# Patient Record
Sex: Female | Born: 1955 | Race: Black or African American | Hispanic: No | Smoking: Never smoker
Health system: Southern US, Community
[De-identification: ages and names within clinical notes are randomized; demographics above are authoritative.]

## PROBLEM LIST (undated history)

## (undated) HISTORY — PX: KNEE SURGERY: SHX244

---

## 2016-10-25 ENCOUNTER — Emergency Department (HOSPITAL_COMMUNITY)
Admission: EM | Admit: 2016-10-25 | Discharge: 2016-10-25 | Disposition: A | Discharge status: Home / Self Care | Payer: Self-pay | Attending: Emergency Medicine | Admitting: Emergency Medicine

## 2016-10-25 ENCOUNTER — Emergency Department (HOSPITAL_COMMUNITY): Payer: Self-pay

## 2016-10-25 ENCOUNTER — Encounter (HOSPITAL_COMMUNITY): Payer: Self-pay

## 2016-10-25 DIAGNOSIS — S92321A Displaced fracture of second metatarsal bone, right foot, initial encounter for closed fracture: Secondary | ICD-10-CM | POA: Insufficient documentation

## 2016-10-25 DIAGNOSIS — W108XXA Fall (on) (from) other stairs and steps, initial encounter: Secondary | ICD-10-CM | POA: Insufficient documentation

## 2016-10-25 DIAGNOSIS — S92331A Displaced fracture of third metatarsal bone, right foot, initial encounter for closed fracture: Secondary | ICD-10-CM | POA: Insufficient documentation

## 2016-10-25 DIAGNOSIS — Y9389 Activity, other specified: Secondary | ICD-10-CM | POA: Insufficient documentation

## 2016-10-25 DIAGNOSIS — Y999 Unspecified external cause status: Secondary | ICD-10-CM | POA: Insufficient documentation

## 2016-10-25 DIAGNOSIS — S82832A Other fracture of upper and lower end of left fibula, initial encounter for closed fracture: Secondary | ICD-10-CM | POA: Insufficient documentation

## 2016-10-25 DIAGNOSIS — W19XXXA Unspecified fall, initial encounter: Secondary | ICD-10-CM

## 2016-10-25 DIAGNOSIS — S92301A Fracture of unspecified metatarsal bone(s), right foot, initial encounter for closed fracture: Secondary | ICD-10-CM

## 2016-10-25 DIAGNOSIS — S92341A Displaced fracture of fourth metatarsal bone, right foot, initial encounter for closed fracture: Secondary | ICD-10-CM | POA: Insufficient documentation

## 2016-10-25 DIAGNOSIS — Y9289 Other specified places as the place of occurrence of the external cause: Secondary | ICD-10-CM | POA: Insufficient documentation

## 2016-10-25 LAB — POC URINE PREG, ED: Preg Test, Ur: NEGATIVE

## 2016-10-25 MED ORDER — OXYCODONE-ACETAMINOPHEN 5-325 MG PO TABS: 1.0000 | ORAL_TABLET | ORAL | 0 refills | Status: AC | PRN

## 2016-10-25 NOTE — ED Provider Notes (Signed)
[facility]-EMERGENCY DEPT Provider Note   CSN: [id number] Arrival date & time: 10/25/16  1431     History   Chief Complaint Chief Complaint  Patient presents with  . Fall    HPI Sara Bauer is a 61 y.o. female.  2 days ago, she fell down about 4 steps injuring her left hip, left ankle, right foot. She has been unable to walk since then. She did ice, head, neck, back, upper extremity injury.   The history is provided by the patient.  Fall     History reviewed. No pertinent past medical history.  There are no active problems to display for this patient.   Past Surgical History:  Procedure Laterality Date  . KNEE SURGERY      OB History    No data available       Home Medications    Prior to Admission medications   Not on File    Family History History reviewed. No pertinent family history.  Social History Social History  Substance Use Topics  . Smoking status: Never Smoker  . Smokeless tobacco: Never Used  . Alcohol use Not on file     Allergies   Patient has no known allergies.   Review of Systems Review of Systems  All other systems reviewed and are negative.    Physical Exam Updated Vital Signs BP (!) 142/87   Pulse 81   Temp 98.8 F (37.1 C) (Oral)   Resp 16   SpO2 100%   Physical Exam  Nursing note and vitals reviewed.  61 year old female, resting comfortably and in no acute distress. Vital signs are significant for borderline hypertension. Oxygen saturation is 100%, which is normal. Head is normocephalic and atraumatic. PERRLA, EOMI. Oropharynx is clear. Neck is nontender and supple without adenopathy or JVD. Back is nontender and there is no CVA tenderness. Lungs are clear without rales, wheezes, or rhonchi. Chest is nontender. Heart has regular rate and rhythm without murmur. Abdomen is soft, flat, nontender without masses or hepatosplenomegaly and peristalsis is normoactive. Extremities: There is mild tenderness to  palpation over lateral aspect of the left hip. There is no tenderness over the pelvis. Full range of motion is present in the left hip without pain. There is no tenderness over the right hip or over either knee. There is no significant knee effusion. Full range of motion is present of both knees without pain. There is moderate swelling of the lateral aspect of the left ankle with point tenderness over the lateral malleolus. There is no instability of the ankle mortise and anterior drawer sign is negative. There is no swelling or tenderness of the right ankle. There is swelling across the dorsum of the right foot with tenderness across the proximal metatarsals. There is no obvious deformity. Distal neurovascular exam is intact on both feet with normal sensation, strong pulses, prompt capillary refill. Neurologic: Mental status is normal, cranial nerves are intact, there are no motor or sensory deficits.  ED Treatments / Results   Radiology Dg Ankle Complete Left  Result Date: 10/25/2016 CLINICAL DATA:  Fall. EXAM: LEFT ANKLE COMPLETE - 3+ VIEW COMPARISON:  None FINDINGS: There is an oblique, intra-articular fracture deformity involving the distal fibula. There is overlying soft tissue swelling. The fracture fragments are in near anatomic alignment. Small plantar heel spur. IMPRESSION: 1. Distal fibular fracture. Electronically Signed   By: Signa Kell M.D.   On: 10/25/2016 18:34   Ct Foot Right Wo Contrast  Result  Date: 10/25/2016 CLINICAL DATA:  60 year old female with fall and metatarsal fracture. EXAM: CT OF THE RIGHT FOOT WITHOUT CONTRAST TECHNIQUE: Multidetector CT imaging of the right foot was performed according to the standard protocol. Multiplanar CT image reconstructions were also generated. COMPARISON:  Radiograph dated 10/25/2016 FINDINGS: Bones/Joint/Cartilage There are minimally displaced fractures of the proximal portion of the second, third, and fourth metatarsals. There is extension of  the fracture line to the articular surface of the base of the fourth metatarsal. A faint linear bony density along the anteromedial aspect of the medial cuneiform noted which may be chronic or represent a small cortical avulsion fracture. No other acute fracture identified. There is no dislocation. The Lisfranc joint appears intact. Ligaments Suboptimally assessed by CT. Muscles and Tendons No intramuscular hematoma. Soft tissues Diffuse soft tissue swelling of the foot. No drainable fluid collection or IMPRESSION: Minimally displaced fractures of the proximal portions of the second- fourth metatarsals with extension of the fracture to the base of the fourth metatarsal articular surface. Faint linear density along the anteromedial aspect of the medial cuneiform may be chronic or represent a cortical avulsion fracture. No dislocation. No evidence of Lisfranc ligament disruption. Electronically Signed   By: Elgie Collard M.D.   On: 10/25/2016 22:25   Dg Knee Complete 4 Views Left  Result Date: 10/25/2016 CLINICAL DATA:  Larey Seat on concrete stairs 2 days ago, pain with ambulation, LEFT sciatic pain EXAM: LEFT KNEE - COMPLETE 4+ VIEW COMPARISON:  None FINDINGS: Osseous demineralization. Joint space narrowing and spur formation at patellofemoral joint and medial compartment. No acute fracture, dislocation, or bone destruction. No knee joint effusion. IMPRESSION: Osteoarthritic changes LEFT knee. No acute abnormalities. Electronically Signed   By: Ulyses Southward M.D.   On: 10/25/2016 18:35   Dg Foot Complete Left  Result Date: 10/25/2016 CLINICAL DATA:  Initial evaluation for acute trauma, fall. EXAM: LEFT FOOT - COMPLETE 3+ VIEW COMPARISON:  None. FINDINGS: No acute fracture or dislocation. Joint spaces maintained. Small plantar calcaneal enthesophyte. Osseous mineralization normal. No soft tissue abnormality. IMPRESSION: No acute osseous abnormality about the left foot. Electronically Signed   By: Rise Mu M.D.   On: 10/25/2016 18:35   Dg Foot Complete Right  Result Date: 10/25/2016 CLINICAL DATA:  Fall. EXAM: RIGHT FOOT COMPLETE - 3+ VIEW COMPARISON:  None. FINDINGS: There are comminuted fracture deformities involving the base of the second third and fourth metatarsal bones. There is slight widening of the space between the base of the first and second metatarsal bones and the space between in medial and middle cuneiform. IMPRESSION: 1. Fracture deformities involve the base of the second third and fourth metatarsal bones. 2. Cannot rule out Lisfranc joint dislocation. Electronically Signed   By: Signa Kell M.D.   On: 10/25/2016 18:40   Dg Hip Unilat W Or Wo Pelvis 2-3 Views Left  Result Date: 10/25/2016 CLINICAL DATA:  61 year old female with fall and left lower back pain. EXAM: DG HIP (WITH OR WITHOUT PELVIS) 2-3V LEFT COMPARISON:  None. FINDINGS: There is no acute fracture or dislocation. The bones are osteopenic. There is mild osteoarthritic changes of the hips as well as degenerative changes of the lower lumbar spine. The soft tissues appear unremarkable. IMPRESSION: No acute fracture or dislocation. Electronically Signed   By: Elgie Collard M.D.   On: 10/25/2016 18:35    Procedures Procedures (including critical care time) SPLINT APPLICATION Date/Time: 10:50 PM Authorized by: ZOXWR,UEAVW Consent: Verbal consent obtained. Risks and benefits: risks, benefits  and alternatives were discussed Consent given by: patient Splint applied by: orthopedic technician Location details: left lower leg  Splint type: cam walker  Supplies used: cam walker Post-procedure: The splinted body part was neurovascularly unchanged following the procedure. Patient tolerance: Patient tolerated the procedure well with no immediate complications.  Medications Ordered in ED Medications - No data to display   Initial Impression / Assessment and Plan / ED Course  I have reviewed the triage vital  signs and the nursing notes.  Pertinent imaging results that were available during my care of the patient were reviewed by me and considered in my medical decision making (see chart for details).  Fall with fracture of the distal left fibula and proximal right second, third, fourth metatarsals. Radiology report states cannot rule out Lisfranc dislocation. Will discuss with orthopedics. Left leg is placed in a cam walker.  Cases discussed Dr. Aundria Rudogers who requests CT be obtained of the right foot to make sure there is no Lisfranc dislocation. If none is present, he states patient can be given a postop shoe and allowed to bear weight on the right foot. CT shows no evidence of dislocation. She is placed in a postop shoe and given crutches. She is to follow-up with orthopedics in the next 5-7 days. Prescription is given for oxycodone have acetaminophen for pain.  Final Clinical Impressions(s) / ED Diagnoses   Final diagnoses:  Fall down steps, initial encounter  Other closed fracture of distal end of left fibula, initial encounter  Closed fracture of metatarsal bone of right foot, physeal involvement unspecified, unspecified metatarsal, initial encounter    New Prescriptions New Prescriptions   OXYCODONE-ACETAMINOPHEN (PERCOCET) 5-325 MG TABLET    Take 1 tablet by mouth every 4 (four) hours as needed for moderate pain.     Dione Boozeavid Shaylon Gillean, MD 10/25/16 2251

## 2016-10-25 NOTE — Discharge Instructions (Signed)
Take ibuprofen or acetaminophen for less sever pain.  You may put weight on your right foot, but do not put any weight on your left foot until the orthopedic doctor tells you it is ok to do so.

## 2016-10-25 NOTE — ED Notes (Signed)
Ortho tech at bedside 

## 2016-10-25 NOTE — ED Triage Notes (Signed)
Pt presents with low back pain, L hip, leg and ankle after falling up concrete steps x 2 days ago.

## 2016-10-25 NOTE — Progress Notes (Signed)
Orthopedic Tech Progress Note Patient Details:  [name] [birth year] [id number]  Ortho Devices Type of Ortho Device: CAM walker Ortho Device/Splint Location: LLE Ortho Device/Splint Interventions: Ordered, Application   Sara Bauer, Sara Bauer 10/25/2016, 9:15 PM

## 2016-10-25 NOTE — ED Notes (Signed)
Pt returned from CT °

## 2016-10-25 NOTE — ED Notes (Signed)
ED Provider at bedside. 

## 2016-10-25 NOTE — ED Notes (Signed)
Patient transported to CT 

## 2016-10-25 NOTE — Progress Notes (Signed)
Orthopedic Tech Progress Note Patient Details:  [facility] 06/24/1956 [id number]  Ortho Devices Type of Ortho Device: Postop shoe/boot, Crutches Ortho Device/Splint Location: RLE Ortho Device/Splint Interventions: Ordered, Application, Adjustment   Jennye MoccasinHughes, [name] [name] 10/25/2016, 11:08 PM

## 2018-07-28 IMAGING — DX DG FOOT COMPLETE 3+V*R*
3 series · 3 of 3 positions shown · non-contrast
Comparison: None.

CLINICAL DATA: Fall.

EXAM:
RIGHT FOOT COMPLETE - 3+ VIEW

[foot ap]
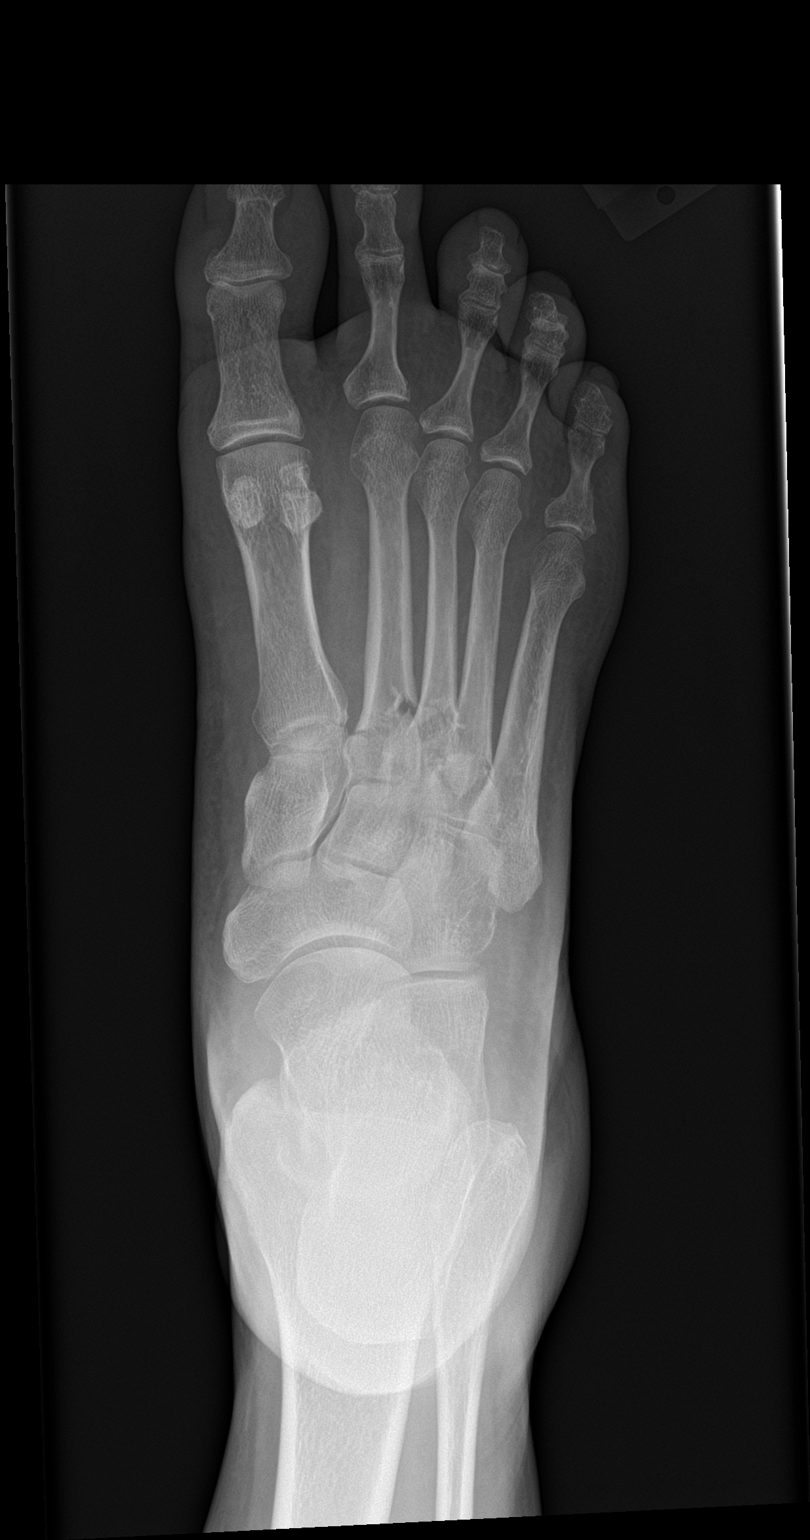

[foot obl]
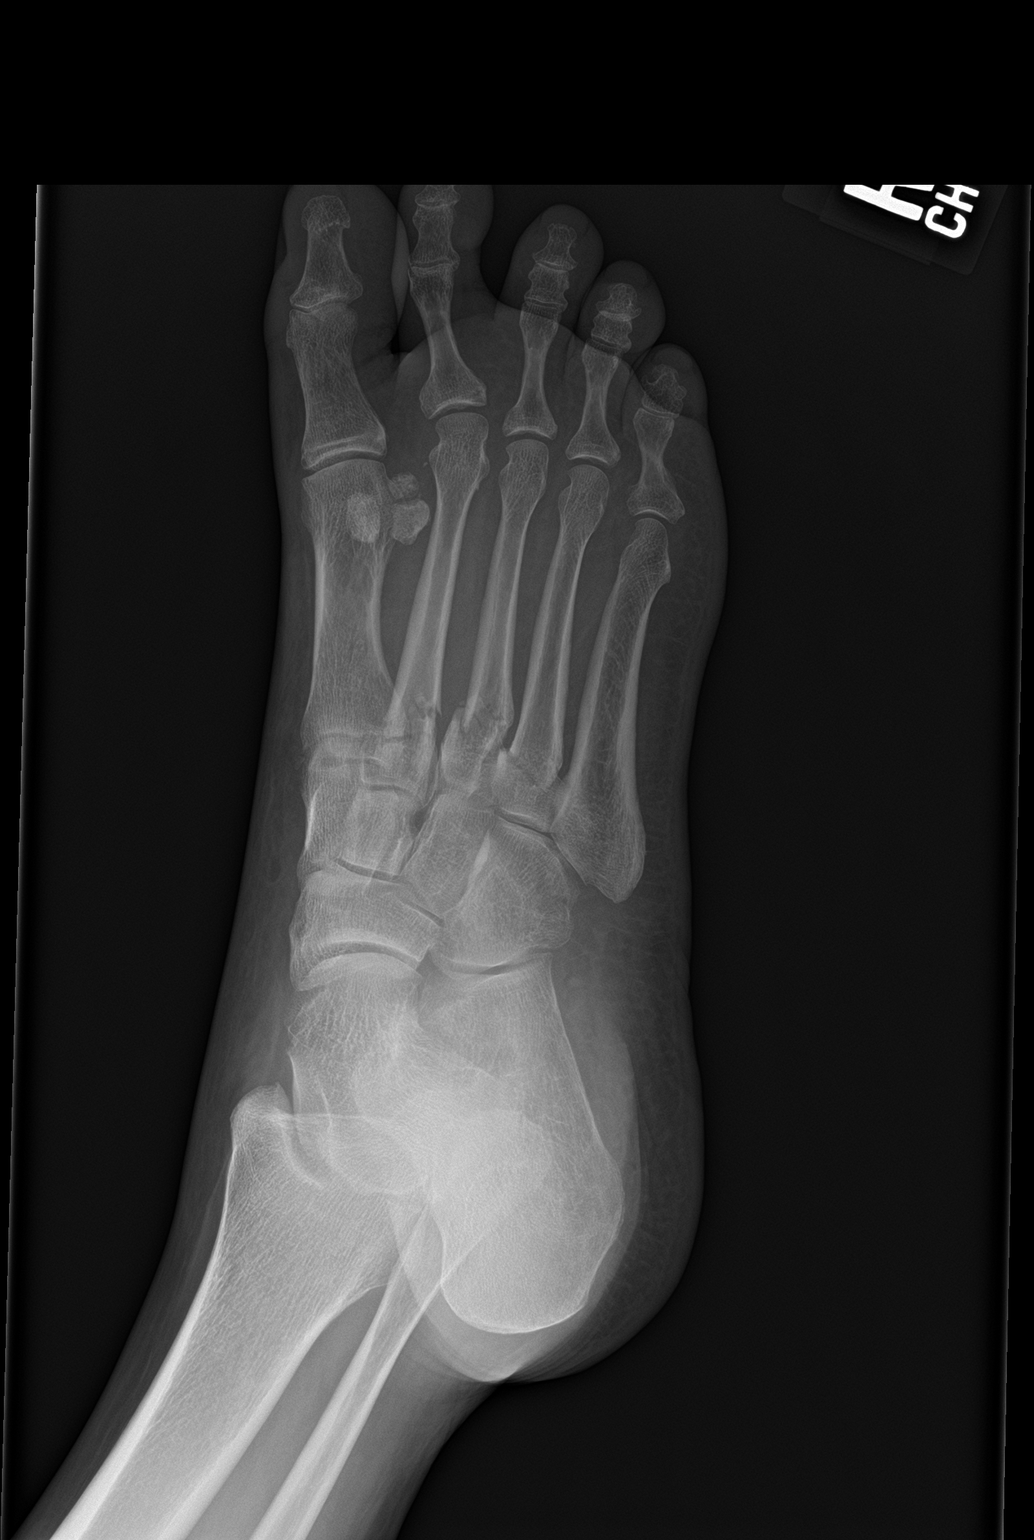

[foot lat]
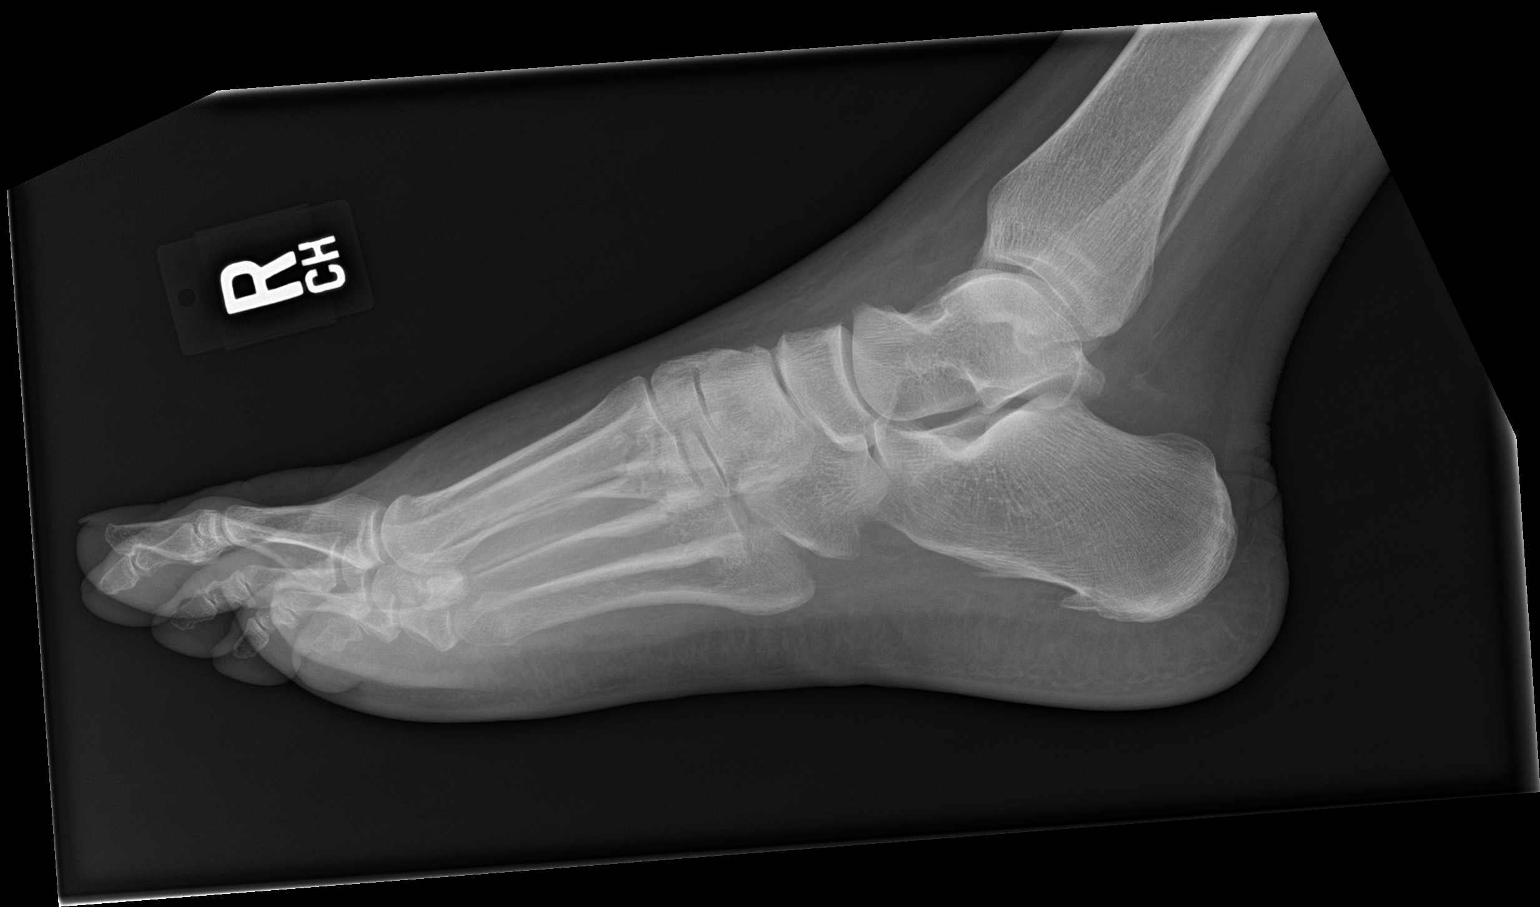

[3 of 3 positions shown; findings below may reference images not displayed]

FINDINGS: There are comminuted fracture deformities involving the base of the
second third and fourth metatarsal bones. There is slight widening
of the space between the base of the first and second metatarsal
bones and the space between in medial and middle cuneiform.
IMPRESSION: 1. Fracture deformities involve the base of the second third and
fourth metatarsal bones.
2. Cannot rule out Lisfranc joint dislocation.

## 2018-07-28 IMAGING — DX DG HIP (WITH OR WITHOUT PELVIS) 2-3V*L*
3 series · 3 of 3 positions shown · non-contrast
Comparison: None.

CLINICAL DATA: 60-year-old female with fall and left lower back
pain.

EXAM:
DG HIP (WITH OR WITHOUT PELVIS) 2-3V LEFT

[pelvis ap]
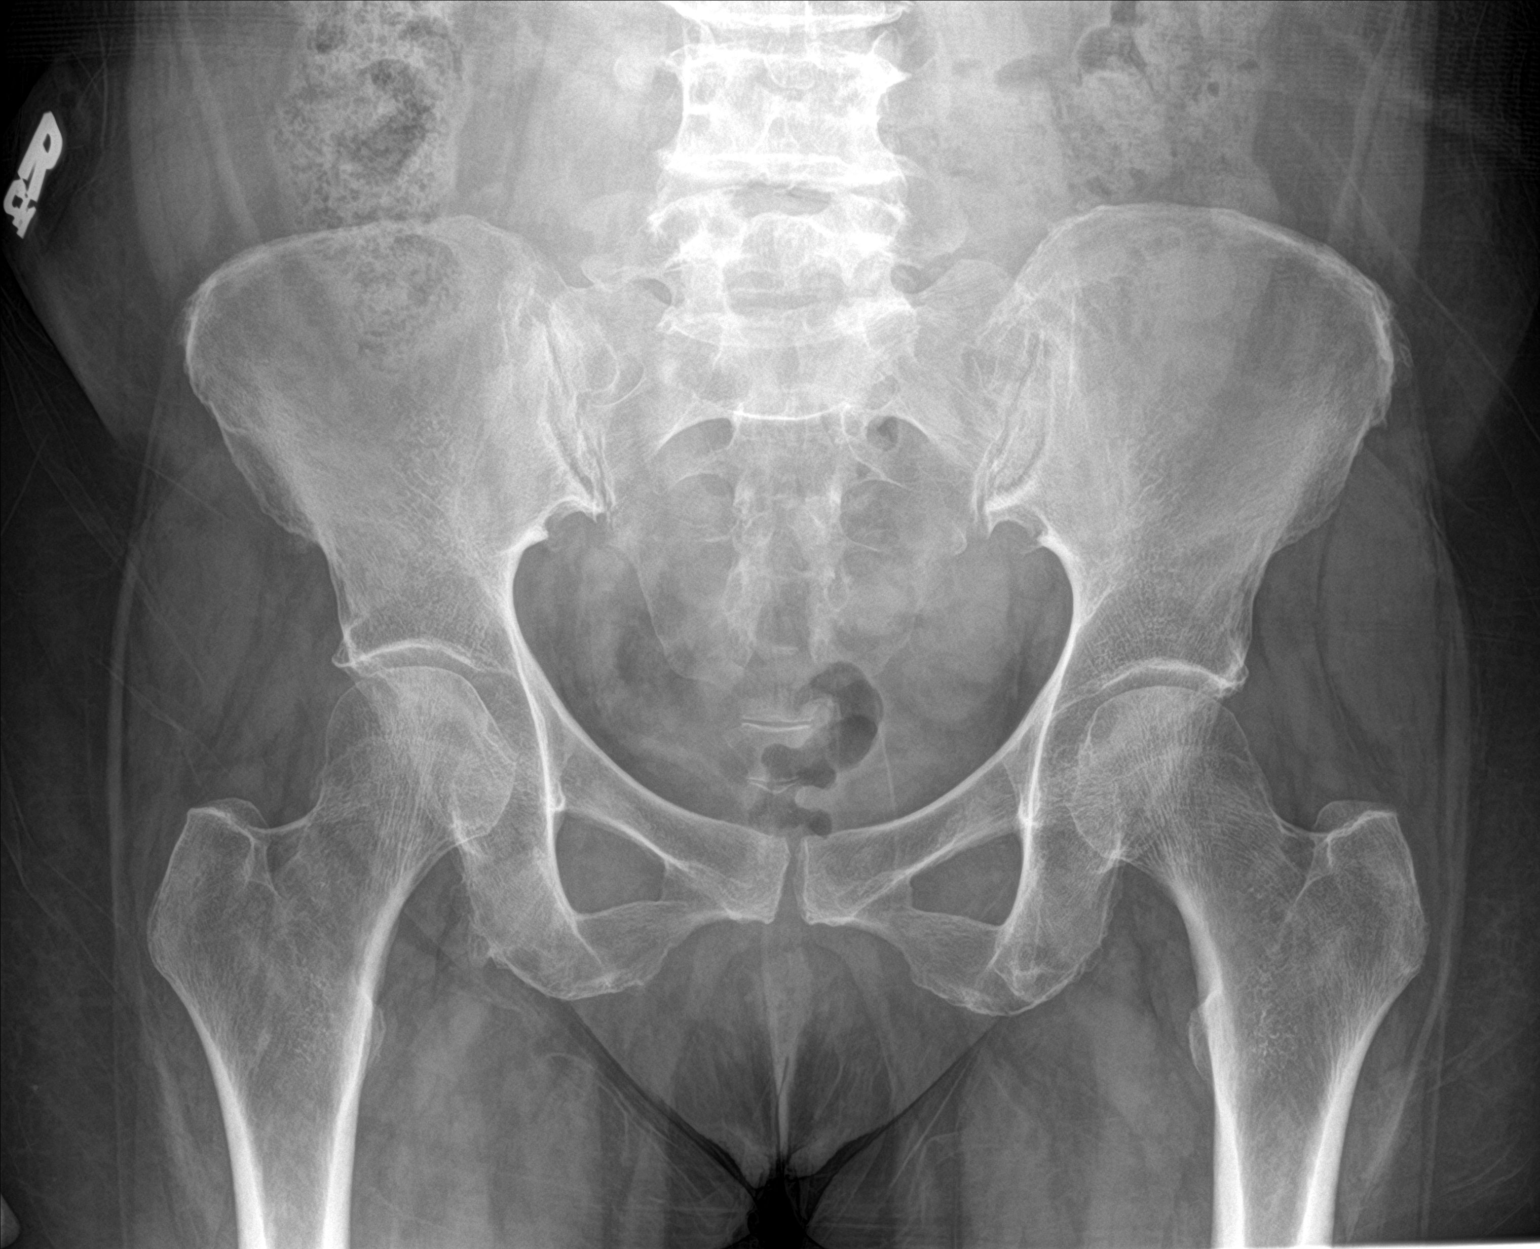

[hip ap]
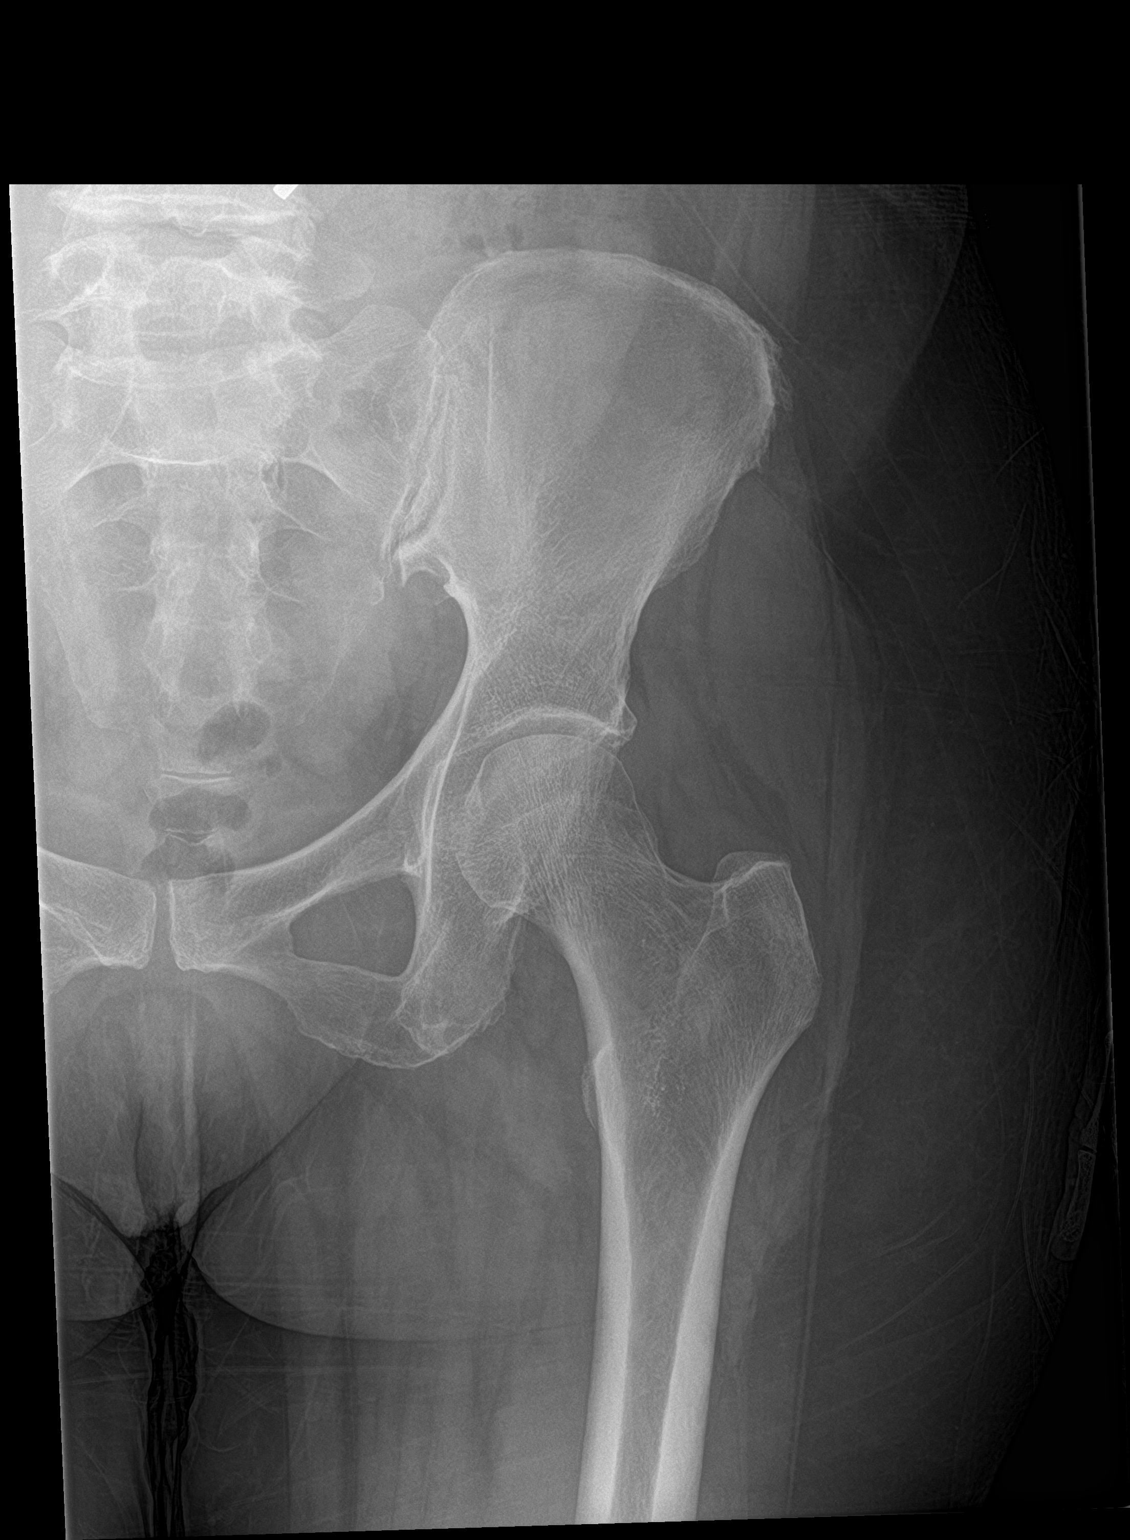

[hip lat]
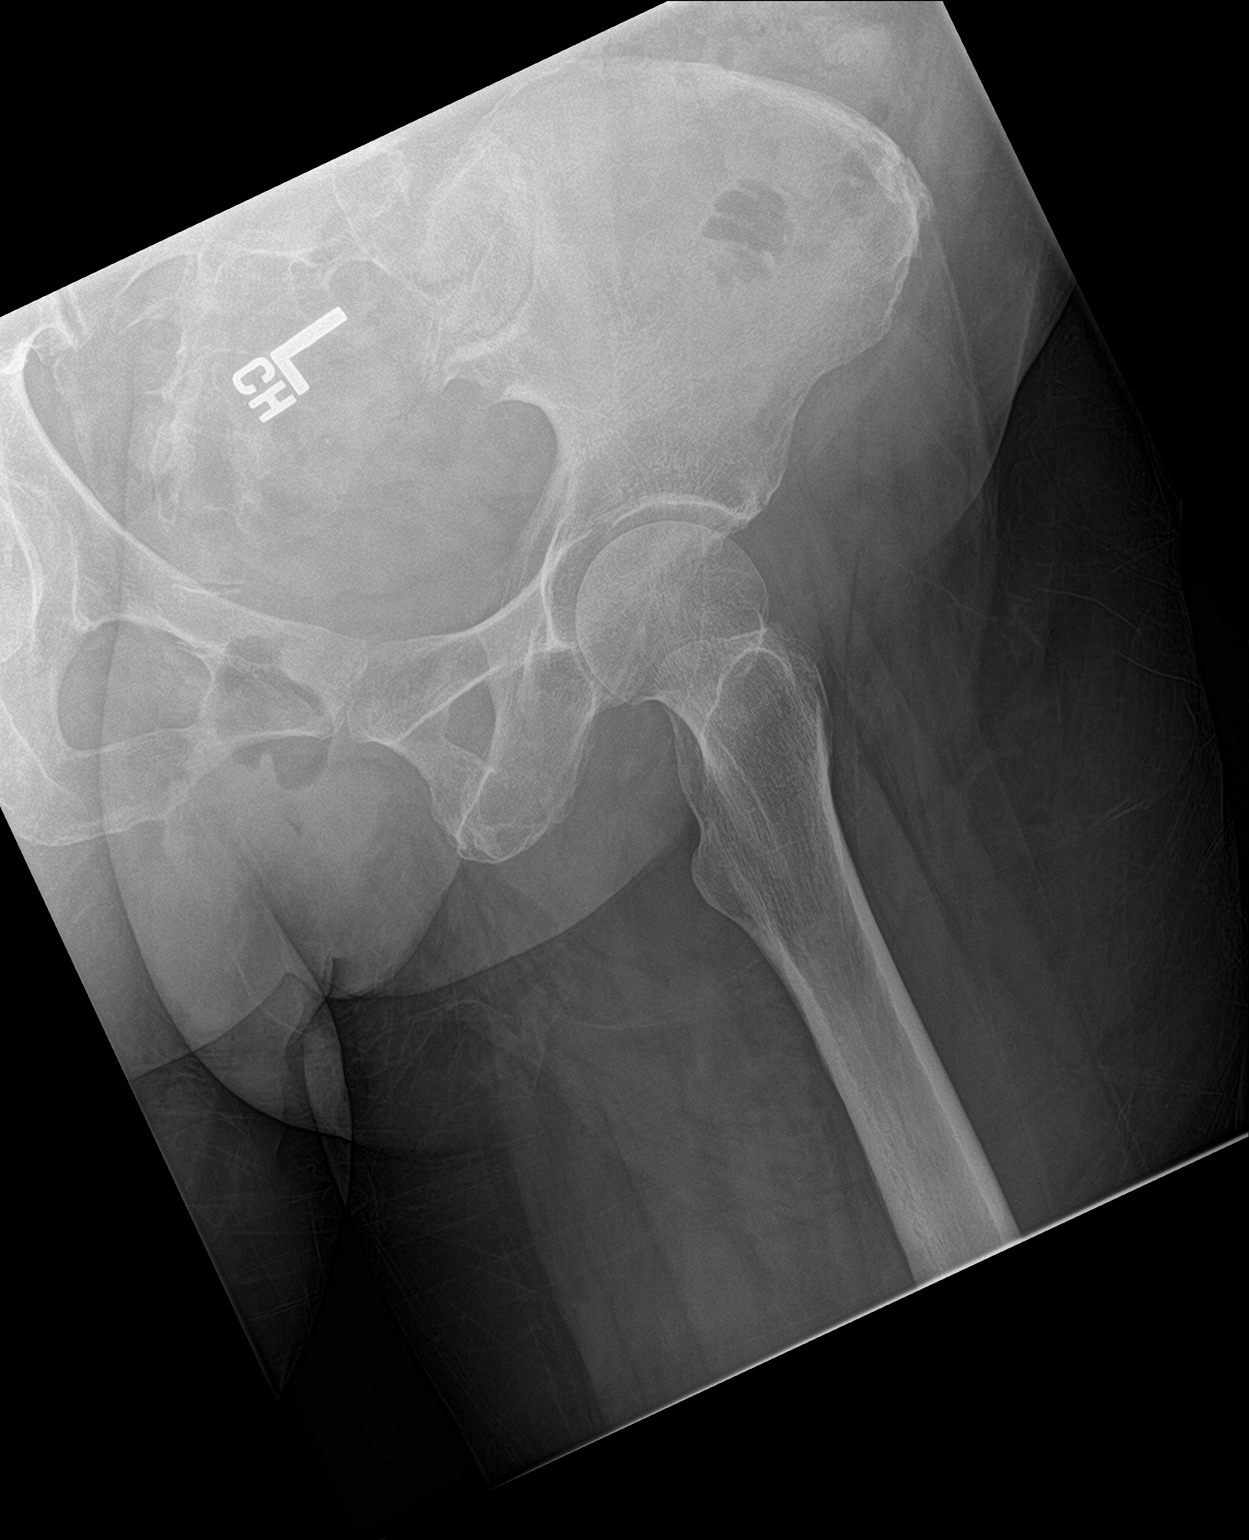

[3 of 3 positions shown; findings below may reference images not displayed]

FINDINGS: There is no acute fracture or dislocation. The bones are osteopenic.
There is mild osteoarthritic changes of the hips as well as
degenerative changes of the lower lumbar spine. The soft tissues
appear unremarkable.
IMPRESSION: No acute fracture or dislocation.
